# Patient Record
Sex: Female | Born: 1960 | Hispanic: No | Marital: Single | Smoking: Current some day smoker
Health system: Southern US, Community
[De-identification: ages and names within clinical notes are randomized; demographics above are authoritative.]

## PROBLEM LIST (undated history)

## (undated) HISTORY — PX: OTHER SURGICAL HISTORY: SHX169

## (undated) HISTORY — PX: CHOLECYSTECTOMY: SHX55

## (undated) HISTORY — PX: TONSILLECTOMY AND ADENOIDECTOMY: SUR1326

---

## 2007-05-30 ENCOUNTER — Emergency Department (HOSPITAL_COMMUNITY): Admission: EM | Admit: 2007-05-30 | Discharge: 2007-05-30 | Payer: Self-pay | Admitting: Emergency Medicine

## 2007-06-03 ENCOUNTER — Encounter: Admission: RE | Admit: 2007-06-03 | Discharge: 2007-06-03 | Payer: Self-pay | Admitting: Family Medicine

## 2007-06-03 ENCOUNTER — Ambulatory Visit: Payer: Self-pay | Admitting: Family Medicine

## 2007-06-03 DIAGNOSIS — J45909 Unspecified asthma, uncomplicated: Secondary | ICD-10-CM | POA: Insufficient documentation

## 2007-06-03 DIAGNOSIS — R109 Unspecified abdominal pain: Secondary | ICD-10-CM | POA: Insufficient documentation

## 2007-06-03 LAB — CONVERTED CEMR LAB
Bilirubin Urine: NEGATIVE
Glucose, Urine, Semiquant: NEGATIVE
Protein, U semiquant: NEGATIVE
Urobilinogen, UA: NEGATIVE
WBC Urine, dipstick: NEGATIVE
pH: 6

## 2007-06-04 ENCOUNTER — Encounter: Admission: RE | Admit: 2007-06-04 | Discharge: 2007-06-04 | Payer: Self-pay | Admitting: Family Medicine

## 2007-06-04 ENCOUNTER — Telehealth: Payer: Self-pay | Admitting: Family Medicine

## 2007-06-05 ENCOUNTER — Telehealth: Payer: Self-pay | Admitting: Family Medicine

## 2007-06-05 ENCOUNTER — Ambulatory Visit: Payer: Self-pay | Admitting: Cardiology

## 2007-06-10 ENCOUNTER — Ambulatory Visit: Payer: Self-pay | Admitting: Family Medicine

## 2007-06-10 DIAGNOSIS — K56609 Unspecified intestinal obstruction, unspecified as to partial versus complete obstruction: Secondary | ICD-10-CM | POA: Insufficient documentation

## 2007-06-11 ENCOUNTER — Ambulatory Visit: Payer: Self-pay | Admitting: Internal Medicine

## 2007-06-11 DIAGNOSIS — K5900 Constipation, unspecified: Secondary | ICD-10-CM | POA: Insufficient documentation

## 2007-06-19 ENCOUNTER — Telehealth (INDEPENDENT_AMBULATORY_CARE_PROVIDER_SITE_OTHER): Payer: Self-pay | Admitting: *Deleted

## 2007-07-06 ENCOUNTER — Telehealth (INDEPENDENT_AMBULATORY_CARE_PROVIDER_SITE_OTHER): Payer: Self-pay | Admitting: *Deleted

## 2007-07-09 ENCOUNTER — Ambulatory Visit: Payer: Self-pay | Admitting: Internal Medicine

## 2007-07-09 ENCOUNTER — Encounter: Payer: Self-pay | Admitting: Family Medicine

## 2007-07-09 DIAGNOSIS — K648 Other hemorrhoids: Secondary | ICD-10-CM | POA: Insufficient documentation

## 2007-07-23 ENCOUNTER — Encounter: Payer: Self-pay | Admitting: Family Medicine

## 2007-07-23 ENCOUNTER — Telehealth (INDEPENDENT_AMBULATORY_CARE_PROVIDER_SITE_OTHER): Payer: Self-pay | Admitting: *Deleted

## 2009-01-16 ENCOUNTER — Other Ambulatory Visit: Admission: RE | Admit: 2009-01-16 | Discharge: 2009-01-16 | Payer: Self-pay | Admitting: Obstetrics and Gynecology

## 2011-03-26 NOTE — Assessment & Plan Note (Signed)
[facility]                         GASTROENTEROLOGY OFFICE NOTE   NAME:[name], Payne                 MRN:          [id number]  DATE:06/11/2007                            DOB:          27-Feb-1961    REFERRING PHYSICIAN:  Lelon Perla, DO   NEW PATIENT CONSULTATION:   CHIEF COMPLAINT:  Constipation and abdominal pain.   HISTORY:  Nicole Payne is a 50 year old female with history of  hypertension, depression and intermittent headaches.  She is status post  cholecystectomy in 1988.  She says that she has had lifelong problems  with constipation and has gone as long as 11 days between bowel  movements at times.  She tries not to take laxatives on a regular basis  because she is usually intolerant to them with abdominal cramping,  diaphoresis, nausea, etc.  She feels that her constipation has been  somewhat worse over the past few months.  Her appetite has been fine,  but she does feel that she has lost 4 or 5 pounds in the past month or  so and says that her weight is down about 40 pounds over the past year  and she apparently has not been intentionally on a weight loss program.  She had increasing constipation last week and some abdominal discomfort,  had taken a laxative on July 25 and then developed problems with pretty  severe diarrhea, cramping, nausea and vomiting.  She was seen by Dr.  Laury Axon and had labs done on the 19th which showed a WBC of 9.6,  hemoglobin 13.4, hematocrit of 41.2.  CT of the abdomen and pelvis was  done on June 05, 2007 and did show some dilated loops of small bowel in  the ileum in the lower pelvis, which also appeared to be filled with  stool.  There was a question of a distal small bowel stricture with  gradual tapering of the distal small bowel towards the terminal ileum.  There was also a small amount of free fluid in the pelvis, uterus and  adnexa unremarkable.  Over this past week, she has had 1 bowel  movement,  does complain of some ongoing mild abdominal discomfort, no nausea or  vomiting and has been eating very light.   CURRENT MEDICATIONS:  1. Protonix 40 mg daily.  2. Hyoscyamine p.r.n.   ALLERGIES:  No known drug allergies.   PAST HISTORY:  As outlined above.  Cholecystectomy was her only previous  surgery.   FAMILY HISTORY:  Negative for GI disease, colon cancers, polyps or  inflammatory bowel disease.  She thinks her mother may have had colon  polyps, but is not certain.  The paternal side of the family as well as  her mother have history of diabetes and there is a family history of  alcoholism.  She is apparently of [place] descent.   REVIEW OF SYSTEMS:  Positive for allergy sinus symptoms and eye glasses.  She does have some difficulty sleeping, intermittent headaches.  PSYCHIATRIC:  Positive for anxiety.  GENERAL:  Pertinent for fatigue.  GYN:  Last menstrual period, August 2007.  GI:  As outlined above.  All  other review of systems negative.   PHYSICAL EXAMINATION:  A well-developed female in no acute distress.  Height is 5 feet 3-1/2 inches.  Weight is 146.  Blood pressure 102/72.  Pulse is 88.  HEENT:  Nontraumatic, normocephalic.  EOMI.  PERLA.  Sclerae anicteric.  NECK:  Supple and without nodes.  CARDIOVASCULAR:  Regular rate and rhythm with S1 and S2, no murmur, rub  or gallop.  PULMONARY:  Clear to A&P.  ABDOMEN:  Soft.  Bowel sounds are active.  She is tender in the right  lower quadrant and right mid quadrant.  There is no palpable mass or  hepatosplenomegaly.  RECTAL:  Hemoccult-negative and without mass.  EXTREMITIES:  Without clubbing, cyanosis, or edema.   IMPRESSION:  [age]-year-old female with chronic constipation with  recent abdominal pain and more gradual weight loss, with abnormal CT  scan of the abdomen and pelvis raising the question of a distal small  bowel stricture, that is, inflammatory bowel disease.   PLAN:  1.  Schedule colonoscopy with intubation of the terminal ileum and      biopsies if indicated.  2. Trial of MiraLax 17 g daily in 8 ounces of water in the interim.      Mike Gip, PA-C  Electronically Signed      Iva Boop, MD,FACG  Electronically Signed   AE/MedQ  DD: 06/11/2007  DT: 06/12/2007  Job #: [id number]   cc:   Lelon Perla, DO

## 2011-08-26 LAB — URINALYSIS, ROUTINE W REFLEX MICROSCOPIC
Leukocytes, UA: NEGATIVE
Nitrite: NEGATIVE
Protein, ur: 30 — AB
Urobilinogen, UA: 0.2

## 2011-08-26 LAB — CBC
HCT: 41.2
MCV: 71.8 — ABNORMAL LOW
Platelets: 238
RDW: 13.7

## 2011-08-26 LAB — DIFFERENTIAL
Basophils Absolute: 0
Basophils Relative: 0
Eosinophils Absolute: 0.1
Eosinophils Relative: 1
Lymphocytes Relative: 16

## 2011-08-26 LAB — BASIC METABOLIC PANEL
BUN: 12
CO2: 26
Chloride: 105
GFR calc Af Amer: >60
GFR calc non Af Amer: >60
Sodium: 139

## 2016-08-31 ENCOUNTER — Other Ambulatory Visit: Payer: Self-pay | Admitting: Obstetrics and Gynecology

## 2016-08-31 DIAGNOSIS — Z1231 Encounter for screening mammogram for malignant neoplasm of breast: Secondary | ICD-10-CM

## 2016-09-12 ENCOUNTER — Ambulatory Visit
Admission: RE | Admit: 2016-09-12 | Discharge: 2016-09-12 | Disposition: A | Discharge status: Home / Self Care | Payer: No Typology Code available for payment source | Source: Ambulatory Visit | Attending: Obstetrics and Gynecology | Admitting: Obstetrics and Gynecology

## 2016-09-12 ENCOUNTER — Encounter (HOSPITAL_COMMUNITY): Payer: Self-pay | Admitting: *Deleted

## 2016-09-12 ENCOUNTER — Ambulatory Visit (HOSPITAL_COMMUNITY)
Admission: RE | Admit: 2016-09-12 | Discharge: 2016-09-12 | Disposition: A | Discharge status: Home / Self Care | Payer: Self-pay | Source: Ambulatory Visit | Attending: Obstetrics and Gynecology | Admitting: Obstetrics and Gynecology

## 2016-09-12 VITALS — BP 118/72 | Temp 98.2°F | Ht 63.0 in | Wt 152.2 lb

## 2016-09-12 DIAGNOSIS — Z1231 Encounter for screening mammogram for malignant neoplasm of breast: Secondary | ICD-10-CM

## 2016-09-12 DIAGNOSIS — Z01419 Encounter for gynecological examination (general) (routine) without abnormal findings: Secondary | ICD-10-CM

## 2016-09-12 NOTE — Progress Notes (Signed)
No complaints today.   Pap Smear: Pap smear completed today. [place] and normal per patient. Per patient has a history of an abnormal Pap smear in 1998 that cryotherapy was completed for follow up. Per patient all Pap smears have been normal since cryotherapy. No Pap smear results are in EPIC.  Physical exam: Breasts Breasts symmetrical. No skin abnormalities bilateral breasts. No nipple retraction bilateral breasts. No nipple discharge bilateral breasts. No lymphadenopathy. No lumps palpated bilateral breasts. No complaints of pain or tenderness on exam. [facility]. Appointment scheduled for Thursday, September 12, 2016 at 1150.  Pelvic/Bimanual   Ext Genitalia No lesions, no swelling and no discharge observed on external genitalia.         Vagina Vagina pink and normal texture. No lesions or discharge observed in vagina.          Cervix Cervix is present. Cervix pink and of normal texture. No discharge observed.     Uterus Uterus is present and palpable. Uterus in normal position and normal size.        Adnexae Bilateral ovaries present and palpable. No tenderness on palpation.          Rectovaginal No rectal exam completed today since patient had no rectal complaints. No skin abnormalities observed on exam.    Smoking History: Patient has never smoked.  Patient Navigation: Patient education provided. Access to services provided for [facility] through [facility].   Colorectal Cancer Screening: Per patient had a colonoscopy completed in 2007 or 2008. No complaints today.

## 2016-09-12 NOTE — Patient Instructions (Signed)
Explained breast self awareness to PublixJacqueline A Sproule. Let patient know that [facility] will cover Pap smears and HPV typing every 5 years unless has a history of abnormal Pap smears. [facility] mamm[facility]. Appointment scheduled for Thursday, September 12, 2016 at 1150. Let patient know will follow up with her within the next couple weeks with results of Pap smear by phone. Informed patient that the Breast Center will follow up with her within the next couple of weeks with results of mammogram by letter or phone. Gerre ScullJacqueline A Bou verbalized understanding.  [place]annock, Kathaleen Maserhristine Poll, RN 10:46 AM

## 2016-09-13 ENCOUNTER — Encounter (HOSPITAL_COMMUNITY): Payer: Self-pay | Admitting: *Deleted

## 2016-09-16 LAB — CYTOLOGY - PAP
Diagnosis: NEGATIVE
HPV: NOT DETECTED

## 2016-09-17 ENCOUNTER — Telehealth (HOSPITAL_COMMUNITY): Payer: Self-pay | Admitting: *Deleted

## 2016-09-17 NOTE — Telephone Encounter (Signed)
Telephoned patient at mobile number and left message to return call to [facility].  

## 2016-09-19 ENCOUNTER — Ambulatory Visit (HOSPITAL_COMMUNITY): Payer: Self-pay

## 2016-09-23 ENCOUNTER — Telehealth (HOSPITAL_COMMUNITY): Payer: Self-pay | Admitting: *Deleted

## 2016-09-23 NOTE — Telephone Encounter (Signed)
Telephoned patient at home number and discussed negative pap smear results, HPV was negative. Next pap smear due in five years. Patient voiced understanding.

## 2017-05-17 IMAGING — MG DIGITAL SCREENING BILATERAL MAMMOGRAM WITH CAD
4 series · 4 of 4 positions shown · non-contrast
Comparison: None.

CLINICAL DATA: Screening.

EXAM:
DIGITAL SCREENING BILATERAL MAMMOGRAM WITH CAD

[R CC]
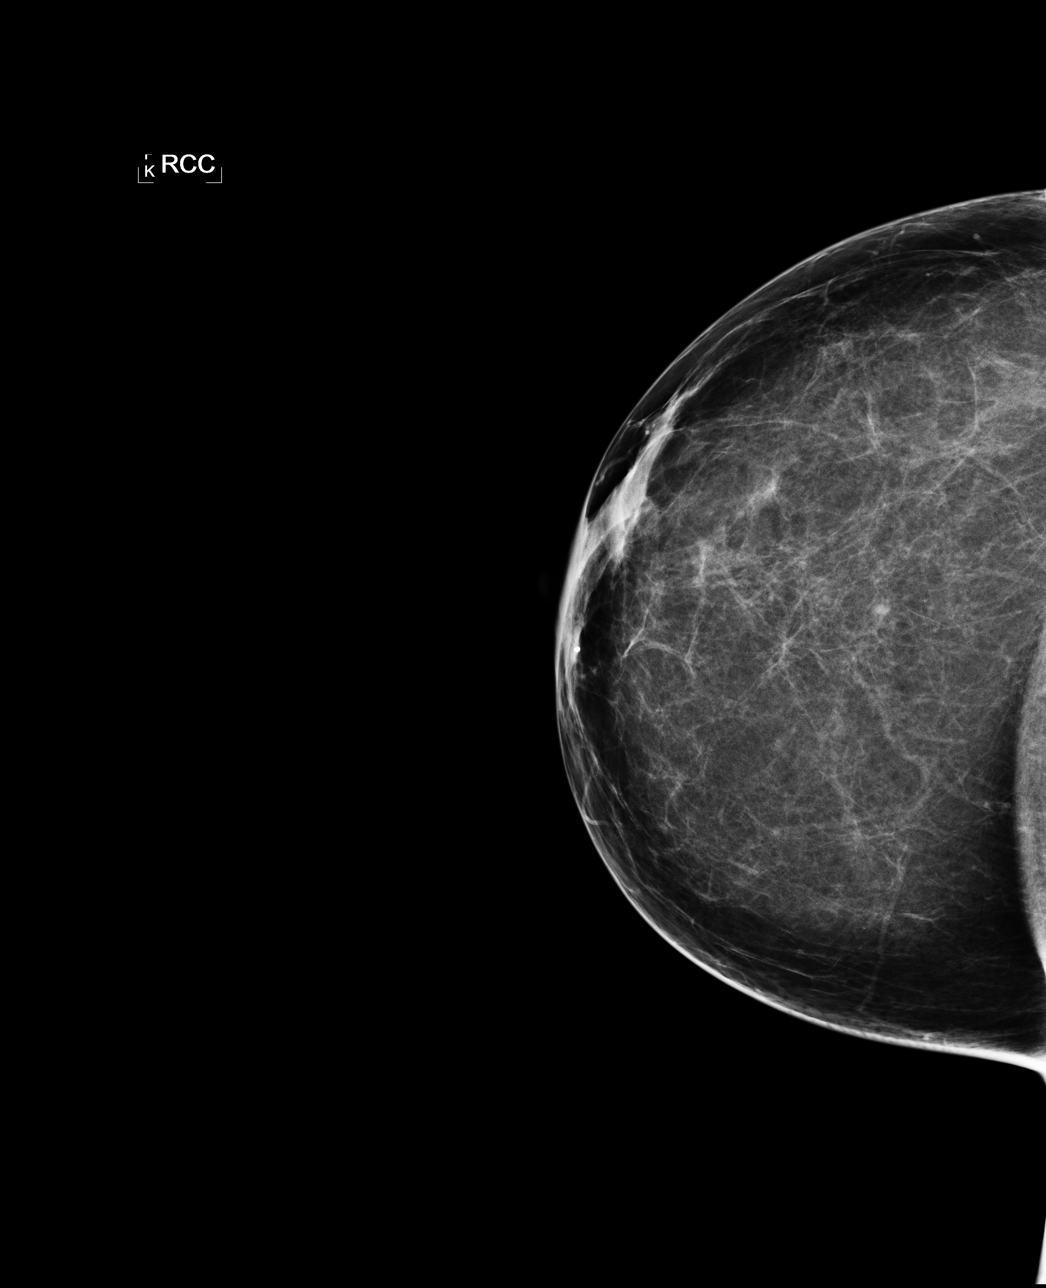

[L CC]
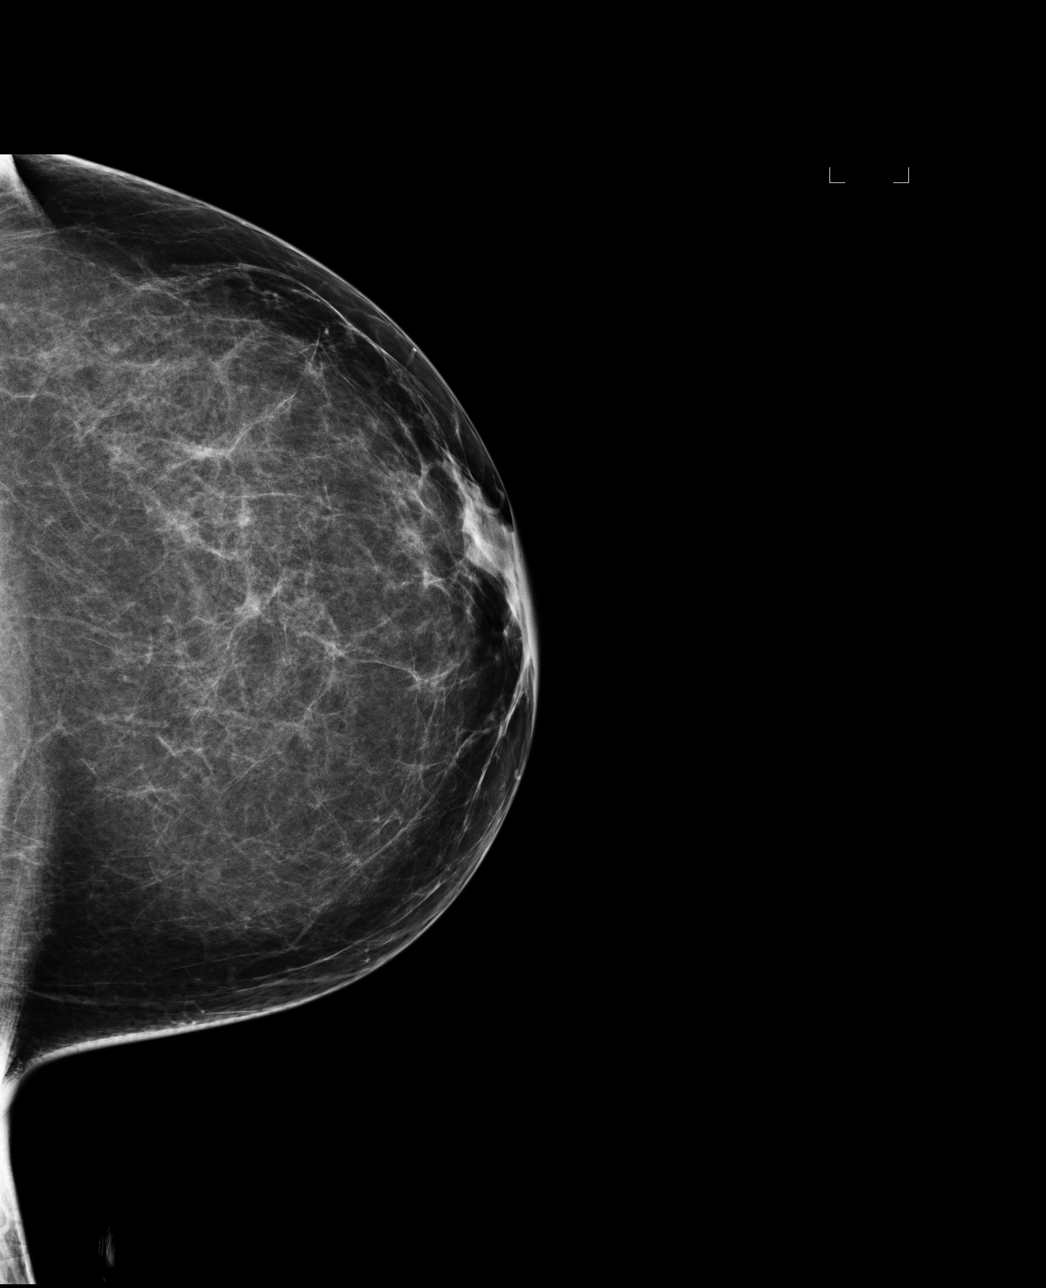

[R MLO]
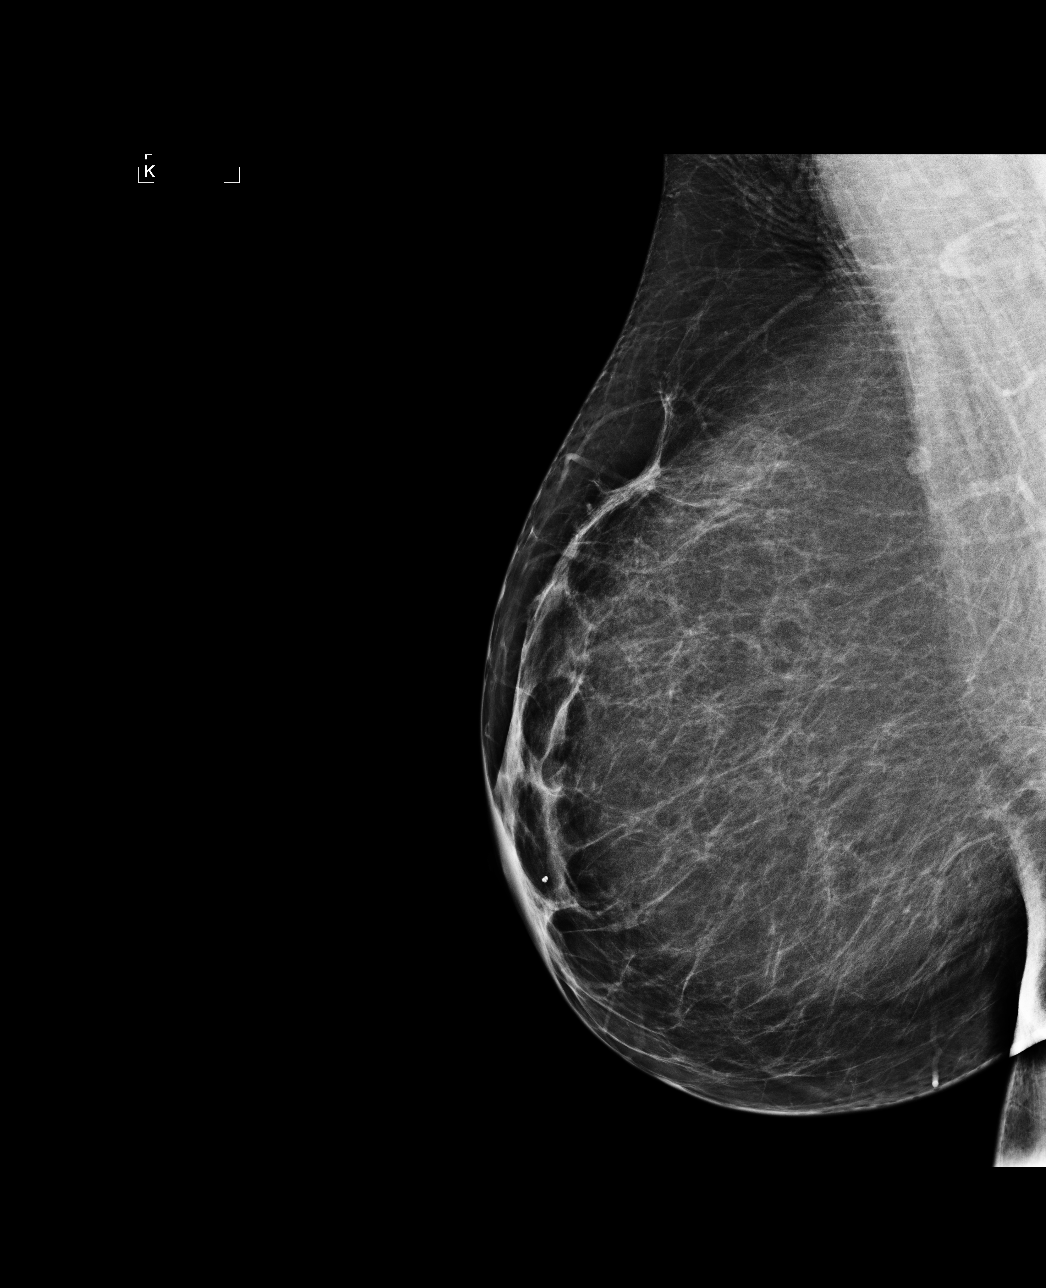

[L MLO]
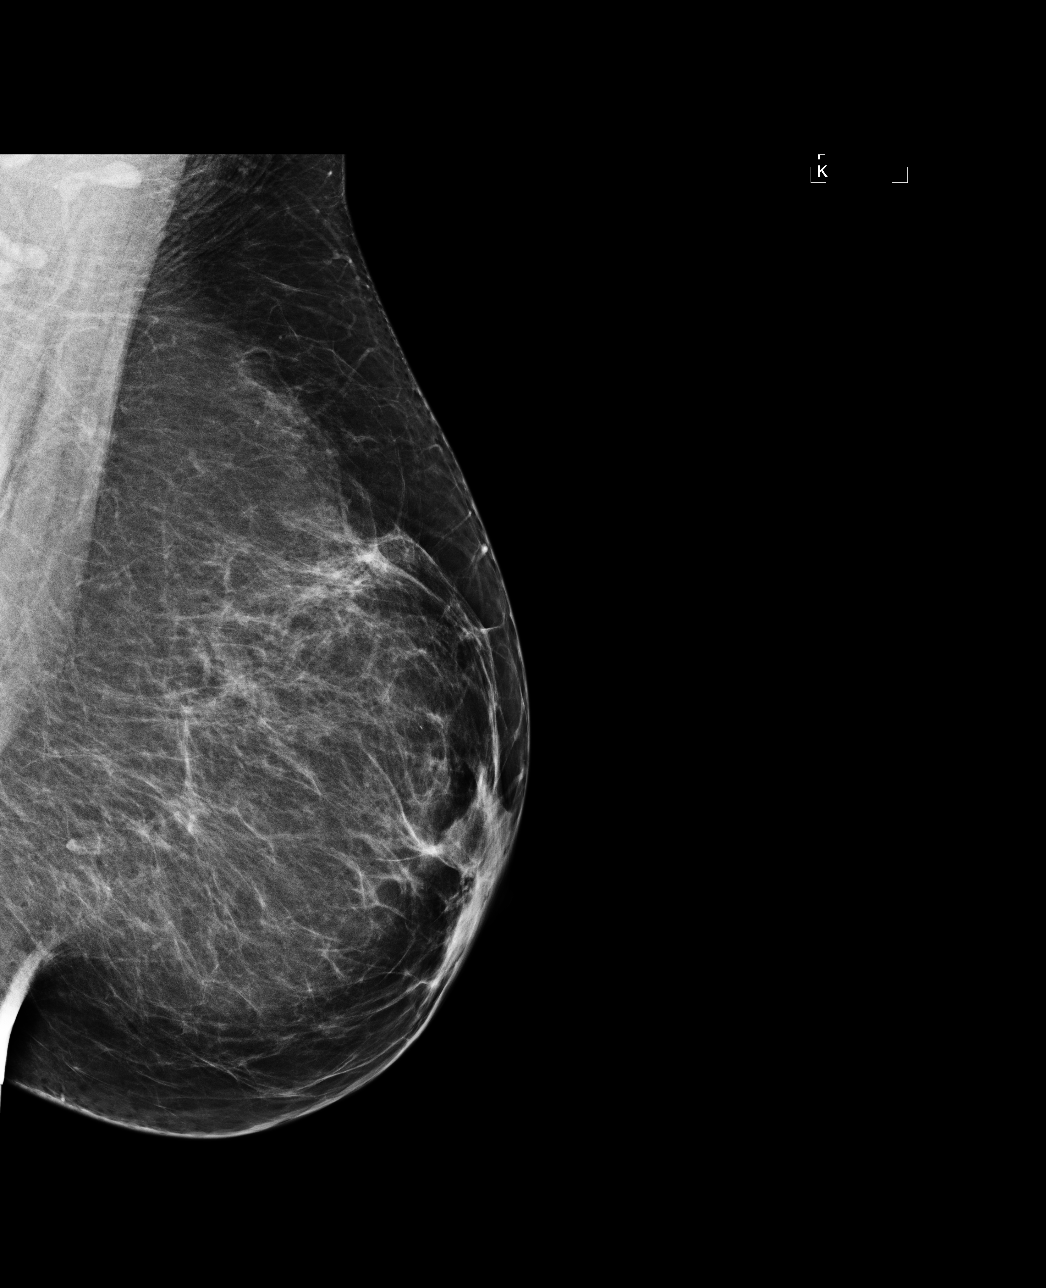

[4 of 4 positions shown; findings below may reference images not displayed]

ACR Breast Density Category b: There are scattered areas of
fibroglandular density.
FINDINGS: There are no findings suspicious for malignancy. Images were
processed with CAD.
IMPRESSION: No mammographic evidence of malignancy. A result letter of this
screening mammogram will be mailed directly to the patient.

RECOMMENDATION:
Screening mammogram in one year. (Code:[id number]-[id number])

BI-RADS CATEGORY  1: Negative.

## 2017-07-29 ENCOUNTER — Encounter: Payer: Self-pay | Admitting: Internal Medicine
